# Patient Record
Sex: Female | Born: 1985 | Race: White | Hispanic: No | Marital: Single | Smoking: Never smoker
Health system: Southern US, Community
[De-identification: ages and names within clinical notes are randomized; demographics above are authoritative.]

---

## 2005-01-25 ENCOUNTER — Emergency Department (HOSPITAL_COMMUNITY): Admission: EM | Admit: 2005-01-25 | Discharge: 2005-01-25 | Payer: Self-pay | Admitting: Emergency Medicine

## 2005-01-27 ENCOUNTER — Emergency Department (HOSPITAL_COMMUNITY): Admission: EM | Admit: 2005-01-27 | Discharge: 2005-01-27 | Payer: Self-pay | Admitting: Emergency Medicine

## 2011-12-11 ENCOUNTER — Other Ambulatory Visit: Payer: Self-pay | Admitting: Obstetrics and Gynecology

## 2011-12-11 DIAGNOSIS — N6320 Unspecified lump in the left breast, unspecified quadrant: Secondary | ICD-10-CM

## 2011-12-15 ENCOUNTER — Ambulatory Visit
Admission: RE | Admit: 2011-12-15 | Discharge: 2011-12-15 | Disposition: A | Discharge status: Home / Self Care | Payer: BC Managed Care – PPO | Source: Ambulatory Visit | Attending: Obstetrics and Gynecology | Admitting: Obstetrics and Gynecology

## 2011-12-15 ENCOUNTER — Other Ambulatory Visit: Payer: Self-pay | Admitting: Obstetrics and Gynecology

## 2011-12-15 DIAGNOSIS — N6320 Unspecified lump in the left breast, unspecified quadrant: Secondary | ICD-10-CM

## 2011-12-22 ENCOUNTER — Ambulatory Visit
Admission: RE | Admit: 2011-12-22 | Discharge: 2011-12-22 | Disposition: A | Discharge status: Home / Self Care | Payer: BC Managed Care – PPO | Source: Ambulatory Visit | Attending: Obstetrics and Gynecology | Admitting: Obstetrics and Gynecology

## 2011-12-22 DIAGNOSIS — N6320 Unspecified lump in the left breast, unspecified quadrant: Secondary | ICD-10-CM

## 2012-02-06 ENCOUNTER — Other Ambulatory Visit: Payer: Self-pay | Admitting: Obstetrics and Gynecology

## 2012-02-06 DIAGNOSIS — R921 Mammographic calcification found on diagnostic imaging of breast: Secondary | ICD-10-CM

## 2012-02-06 DIAGNOSIS — N63 Unspecified lump in unspecified breast: Secondary | ICD-10-CM

## 2012-02-20 ENCOUNTER — Other Ambulatory Visit: Payer: BC Managed Care – PPO

## 2012-02-25 ENCOUNTER — Ambulatory Visit
Admission: RE | Admit: 2012-02-25 | Discharge: 2012-02-25 | Disposition: A | Discharge status: Home / Self Care | Payer: BC Managed Care – PPO | Source: Ambulatory Visit | Attending: Obstetrics and Gynecology | Admitting: Obstetrics and Gynecology

## 2012-02-25 DIAGNOSIS — N63 Unspecified lump in unspecified breast: Secondary | ICD-10-CM

## 2012-03-09 ENCOUNTER — Other Ambulatory Visit: Payer: BC Managed Care – PPO

## 2012-09-27 ENCOUNTER — Other Ambulatory Visit: Payer: Self-pay | Admitting: Obstetrics and Gynecology

## 2012-09-27 DIAGNOSIS — N6320 Unspecified lump in the left breast, unspecified quadrant: Secondary | ICD-10-CM

## 2012-09-27 DIAGNOSIS — N644 Mastodynia: Secondary | ICD-10-CM

## 2012-10-11 ENCOUNTER — Ambulatory Visit
Admission: RE | Admit: 2012-10-11 | Discharge: 2012-10-11 | Disposition: A | Discharge status: Home / Self Care | Payer: BC Managed Care – PPO | Source: Ambulatory Visit | Attending: Obstetrics and Gynecology | Admitting: Obstetrics and Gynecology

## 2012-10-11 DIAGNOSIS — N6320 Unspecified lump in the left breast, unspecified quadrant: Secondary | ICD-10-CM

## 2012-10-11 DIAGNOSIS — N644 Mastodynia: Secondary | ICD-10-CM

## 2013-05-19 ENCOUNTER — Other Ambulatory Visit: Payer: Self-pay | Admitting: Physician Assistant

## 2013-05-19 ENCOUNTER — Ambulatory Visit
Admission: RE | Admit: 2013-05-19 | Discharge: 2013-05-19 | Disposition: A | Discharge status: Home / Self Care | Payer: BC Managed Care – PPO | Source: Ambulatory Visit | Attending: Physician Assistant | Admitting: Physician Assistant

## 2013-05-19 DIAGNOSIS — M549 Dorsalgia, unspecified: Secondary | ICD-10-CM

## 2015-04-09 ENCOUNTER — Emergency Department (HOSPITAL_COMMUNITY): Payer: BC Managed Care – PPO

## 2015-04-09 ENCOUNTER — Emergency Department (HOSPITAL_COMMUNITY)
Admission: EM | Admit: 2015-04-09 | Discharge: 2015-04-10 | Disposition: A | Discharge status: Home / Self Care | Payer: BC Managed Care – PPO | Attending: Emergency Medicine | Admitting: Emergency Medicine

## 2015-04-09 ENCOUNTER — Encounter (HOSPITAL_COMMUNITY): Payer: Self-pay

## 2015-04-09 DIAGNOSIS — Z3202 Encounter for pregnancy test, result negative: Secondary | ICD-10-CM | POA: Insufficient documentation

## 2015-04-09 DIAGNOSIS — N83202 Unspecified ovarian cyst, left side: Secondary | ICD-10-CM

## 2015-04-09 DIAGNOSIS — R1030 Lower abdominal pain, unspecified: Secondary | ICD-10-CM | POA: Diagnosis present

## 2015-04-09 DIAGNOSIS — R102 Pelvic and perineal pain: Secondary | ICD-10-CM

## 2015-04-09 LAB — WET PREP, GENITAL
Clue Cells Wet Prep HPF POC: NONE SEEN
Sperm: NONE SEEN
Trich, Wet Prep: NONE SEEN
Yeast Wet Prep HPF POC: NONE SEEN

## 2015-04-09 LAB — URINALYSIS, ROUTINE W REFLEX MICROSCOPIC
Bilirubin Urine: NEGATIVE
Glucose, UA: NEGATIVE mg/dL
HGB URINE DIPSTICK: NEGATIVE
Ketones, ur: NEGATIVE mg/dL
Leukocytes, UA: NEGATIVE
Nitrite: NEGATIVE
PH: 5.5 (ref 5.0–8.0)
PROTEIN: NEGATIVE mg/dL
SPECIFIC GRAVITY, URINE: 1.026 (ref 1.005–1.030)

## 2015-04-09 LAB — POC URINE PREG, ED: Preg Test, Ur: NEGATIVE

## 2015-04-09 NOTE — Progress Notes (Signed)
Patient listed as having [employer] insurance without a pcp.  EDCM spoke to patient at bedside.  Patient reports her pcp is located at the [facility] [place].  System updated.

## 2015-04-09 NOTE — ED Notes (Signed)
Pt complains of lower abd pain since yesterday at 4pm, no vomiting or diarrhea

## 2015-04-09 NOTE — ED Provider Notes (Signed)
CSN: [id number]     Arrival date & time 04/09/15  2017 History   First MD Initiated Contact with Patient 04/09/15 2128     Chief Complaint  Patient presents with  . Abdominal Pain   (Consider location/radiation/quality/duration/timing/severity/associated sxs/prior Treatment) HPI 30 y.o. female presents to the Emergency Department today complaining of lower abdominal pain since yesterday around 4p. States that the pain is a 6/10 and a dull ache. Constant. Most of the pain is isolated suprapubic. Pain appears better with rest, but worse with movement. OTC treatments without relief. No fevers. No CP/SOB. Notes Dysuria. No vaginal bleeding/discahrge/itching. No other symptoms noted.    LMP- 1 month ago  History reviewed. No pertinent past medical history. History reviewed. No pertinent past surgical history. History reviewed. No pertinent family history. Social History  Substance Use Topics  . Smoking status: Never Smoker   . Smokeless tobacco: None  . Alcohol Use: No   OB History    No data available     Review of Systems ROS reviewed and all are negative for acute change except as noted in the HPI.  Allergies  Review of patient's allergies indicates no known allergies.  Home Medications   Prior to Admission medications   Medication Sig Start Date End Date Taking? Authorizing Provider  ibuprofen (ADVIL,MOTRIN) 200 MG tablet Take 400 mg by mouth every 6 (six) hours as needed for headache or moderate pain.   Yes Historical Provider, MD  Probiotic Product (PROBIOTIC PO) Take 1 capsule by mouth daily as needed (abdominal pain).   Yes Historical Provider, MD   BP 122/91 mmHg  Pulse 91  Temp(Src) 97.8 F (36.6 C) (Oral)  Resp 16  SpO2 99%  LMP 03/16/2015   Physical Exam  Constitutional: She is oriented to person, place, and time. She appears well-developed and well-nourished.  HENT:  Head: Normocephalic and atraumatic.  Eyes: EOM are normal. Pupils are equal, round, and  reactive to light.  Neck: Normal range of motion. Neck supple.  Cardiovascular: Normal rate, regular rhythm, normal heart sounds and intact distal pulses.   No murmur heard. Pulmonary/Chest: Effort normal and breath sounds normal. No respiratory distress. She has no wheezes. She has no rales. She exhibits no tenderness.  Abdominal: Soft. Normal appearance and bowel sounds are normal. There is tenderness in the suprapubic area. There is no rigidity, no rebound, no guarding, no CVA tenderness, no tenderness at McBurney's point and negative Murphy's sign.  Musculoskeletal: Normal range of motion.  Neurological: She is alert and oriented to person, place, and time.  Skin: Skin is warm and dry.  Psychiatric: She has a normal mood and affect. Her behavior is normal. Thought content normal.  Nursing note and vitals reviewed.  ED Course  Procedures (including critical care time) Labs Review Labs Reviewed  WET PREP, GENITAL - Abnormal; Notable for the following:    WBC, Wet Prep HPF POC MODERATE (*)    All other components within normal limits  URINALYSIS, ROUTINE W REFLEX MICROSCOPIC (NOT AT [facility]) - Abnormal; Notable for the following:    Color, Urine AMBER (*)    APPearance CLOUDY (*)    All other components within normal limits  POC URINE PREG, ED  GC/CHLAMYDIA PROBE AMP ([place]) NOT AT [facility]   Imaging Review [place] Transvaginal Non-ob  04/10/2015  CLINICAL DATA:  Lower abdominal pain, onset at 16:00. EXAM: TRANSABDOMINAL AND TRANSVAGINAL ULTRASOUND OF PELVIS TECHNIQUE: Both transabdominal and transvaginal ultrasound examinations of the pelvis were performed. Transabdominal technique  was performed for global imaging of the pelvis including uterus, ovaries, adnexal regions, and pelvic cul-de-sac. It was necessary to proceed with endovaginal exam following the transabdominal exam to visualize the ovaries. COMPARISON:  None FINDINGS: Uterus Measurements: 6.5 x 3.7 x 4.4 cm. No fibroids or other  mass visualized. Endometrium Thickness: 7 mm.  No focal abnormality visualized. Right ovary Measurements: 2.4 x 2.4 x 2.3 cm. Normal appearance/no adnexal mass. Left ovary Measurements: 6.2 x 4.2 x 6.1 cm. There is a partially collapsed mildly complex cyst measuring 0.9 x 2.2 x 3.1 cm. This may be hemorrhagic. Other findings Small to moderate volume free pelvic fluid with mild complexity, suggesting hemorrhage. IMPRESSION: Partially collapsed left ovarian cyst. Mildly complex small-moderate volume pelvic fluid could represent acute hemorrhage. Electronically Signed   By: Ellery Plunk M.D.   On: 04/10/2015 00:18   US Pelvis Complete  04/10/2015  CLINICAL DATA:  Lower abdominal pain, onset at 16:00. EXAM: TRANSABDOMINAL AND TRANSVAGINAL ULTRASOUND OF PELVIS TECHNIQUE: Both transabdominal and transvaginal ultrasound examinations of the pelvis were performed. Transabdominal technique was performed for global imaging of the pelvis including uterus, ovaries, adnexal regions, and pelvic cul-de-sac. It was necessary to proceed with endovaginal exam following the transabdominal exam to visualize the ovaries. COMPARISON:  None FINDINGS: Uterus Measurements: 6.5 x 3.7 x 4.4 cm. No fibroids or other mass visualized. Endometrium Thickness: 7 mm.  No focal abnormality visualized. Right ovary Measurements: 2.4 x 2.4 x 2.3 cm. Normal appearance/no adnexal mass. Left ovary Measurements: 6.2 x 4.2 x 6.1 cm. There is a partially collapsed mildly complex cyst measuring 0.9 x 2.2 x 3.1 cm. This may be hemorrhagic. Other findings Small to moderate volume free pelvic fluid with mild complexity, suggesting hemorrhage. IMPRESSION: Partially collapsed left ovarian cyst. Mildly complex small-moderate volume pelvic fluid could represent acute hemorrhage. Electronically Signed   By: Ellery Plunk M.D.   On: 04/10/2015 00:18   I have personally reviewed and evaluated these images and lab results as part of my medical  decision-making.   EKG Interpretation None      MDM  I have reviewed and evaluated the relevant laboratory values. I have reviewed and evaluated the relevant imaging studies. I have reviewed the relevant previous healthcare records. I obtained HPI from historian. Patient discussed with supervising physician  ED Course:  Assessment: Pt is a 29yF who presents with lower abdominal pain since yesterday. On exam, pt in NAD. Nontoxic/nonseptic appearing. VSS. Afebrile. Lungs CTA. Heart RRR. Abdomen TTP suprapubic. Pelvic exam unreamarkable. Labs unreamrkable. Pelvic US showed collapsed ovarian cyst, which could be attributing to the symptoms. Plan is to DC home with symptomatic treatment and follow up with PCP. At time of discharge, Patient is in no acute distress. Vital Signs are stable. Patient is able to ambulate. Patient able to tolerate PO.    Disposition/Plan:  DC Home Additional Verbal discharge instructions given and discussed with patient.  Pt Instructed to f/u with PCP in the next 48 hours for evaluation and treatment of symptoms. Return precautions given Pt acknowledges and agrees with plan  Supervising Physician Melene Plan, DO   Final diagnoses:  Cyst of left ovary      Audry Pili, PA-C 04/10/15 0030  Lavera Guise, MD 04/10/15 [phone]

## 2015-04-10 LAB — GC/CHLAMYDIA PROBE AMP (~~LOC~~) NOT AT ARMC
Chlamydia: NEGATIVE
Neisseria Gonorrhea: NEGATIVE

## 2015-04-10 NOTE — ED Notes (Signed)
Patient d/c'd self care.  F/U reviewed.  Patient verbalized understanding. 

## 2015-04-10 NOTE — Discharge Instructions (Signed)
Please read and follow all provided instructions.  Your diagnoses today include:  1. Cyst of left ovary   2. Pelvic pain in female    Tests performed today include:  Vital signs. See below for your results today.   Medications prescribed:   None   Home care instructions:  Follow any educational materials contained in this packet.  Follow-up instructions: Please follow-up with your primary care provider in the next week for further evaluation of symptoms and treatment   Return instructions:   Please return to the Emergency Department if you do not get better, if you get worse, or new symptoms OR  - Fever (temperature greater than 101.32F)  - Bleeding that does not stop with holding pressure to the area    -Severe pain (please note that you may be more sore the day after your accident)  - Chest Pain  - Difficulty breathing  - Severe nausea or vomiting  - Inability to tolerate food and liquids  - Passing out  - Skin becoming red around your wounds  - Change in mental status (confusion or lethargy)  - New numbness or weakness     Please return if you have any other emergent concerns.  Additional Information:  Your vital signs today were: BP 122/91 mmHg   Pulse 91   Temp(Src) 97.8 F (36.6 C) (Oral)   Resp 16   SpO2 99%   LMP 03/16/2015 If your blood pressure (BP) was elevated above 135/85 this visit, please have this repeated by your doctor within one month. ---------------

## 2017-01-24 IMAGING — US US TRANSVAGINAL NON-OB
1 series · 14 of 25 positions shown · non-contrast
Comparison: None

CLINICAL DATA: Lower abdominal pain, onset at [DATE].



[Series 1: us transvaginal non-ob · 0.24mm/px · 14 of 53 slices shown]
[im 1/53]
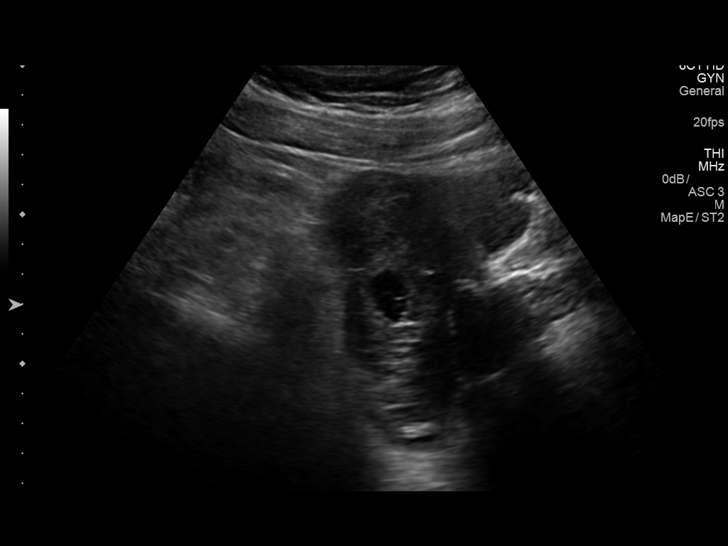
[im 5/53]
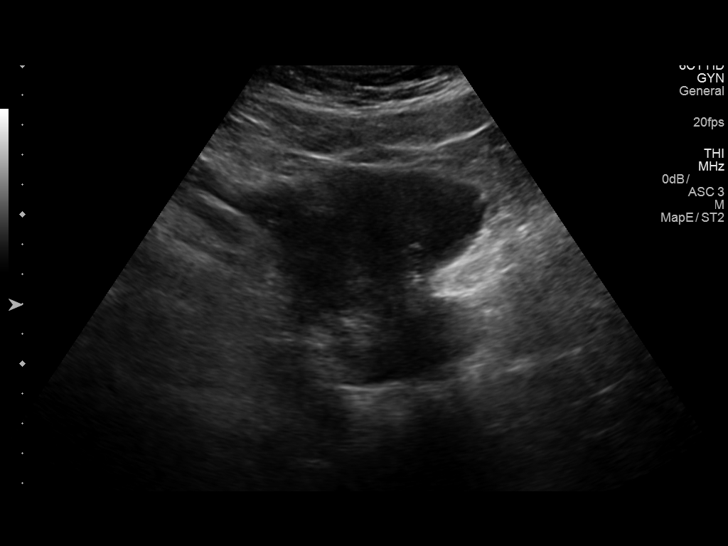
[im 9/53]
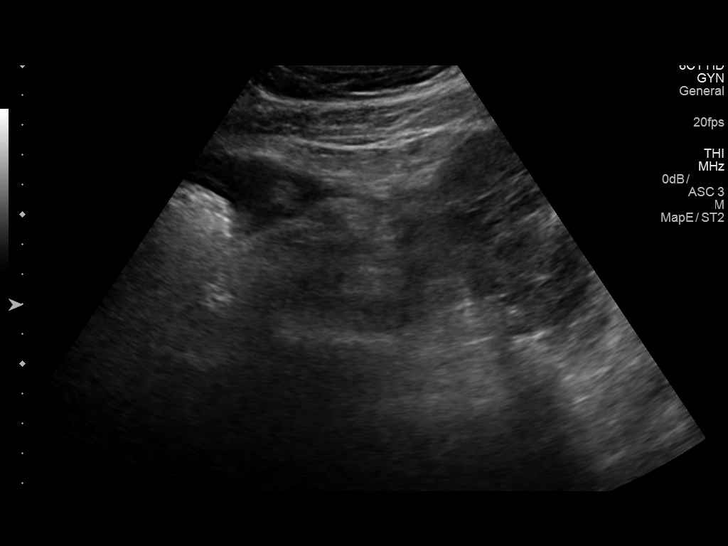
[im 14/53]
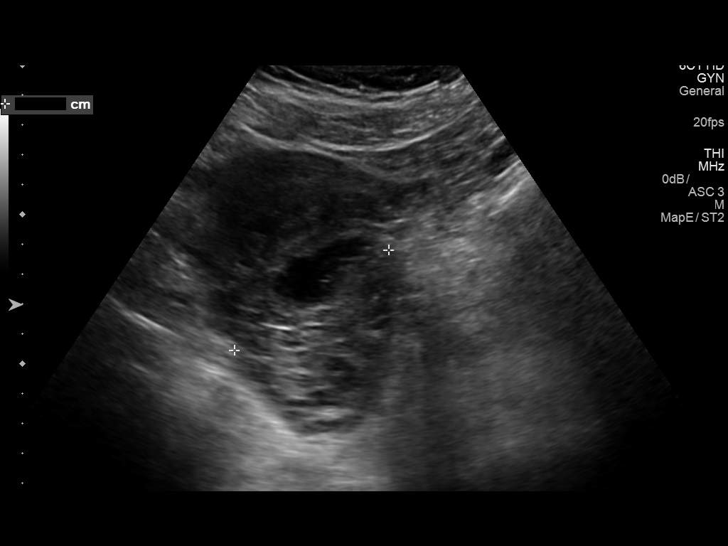
[im 18/53]
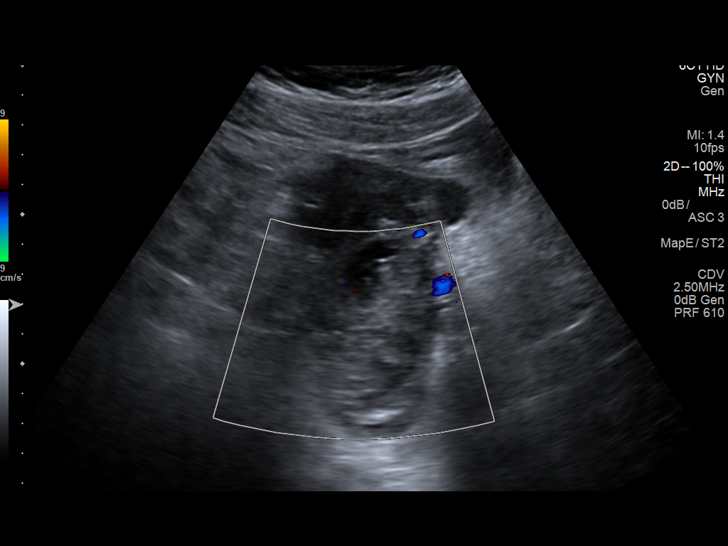
[im 20/53]
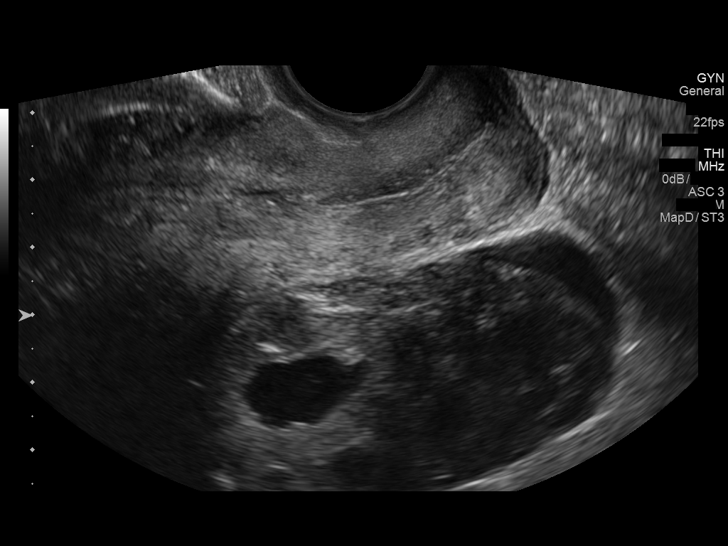
[im 24/53]
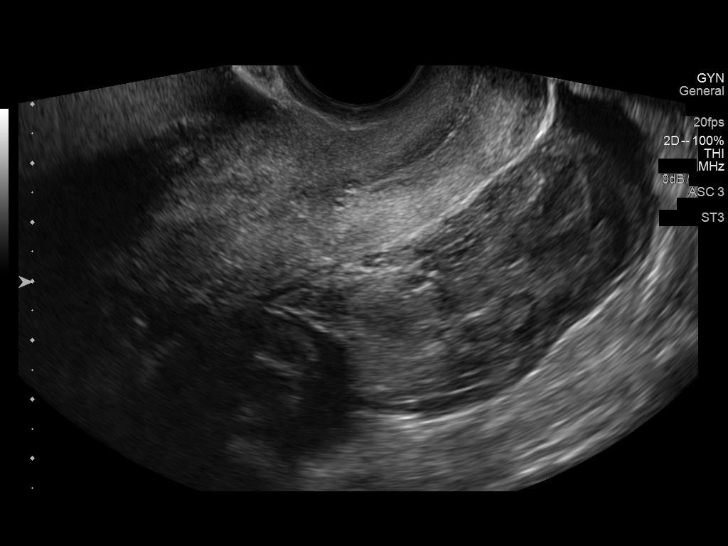
[im 29/53]
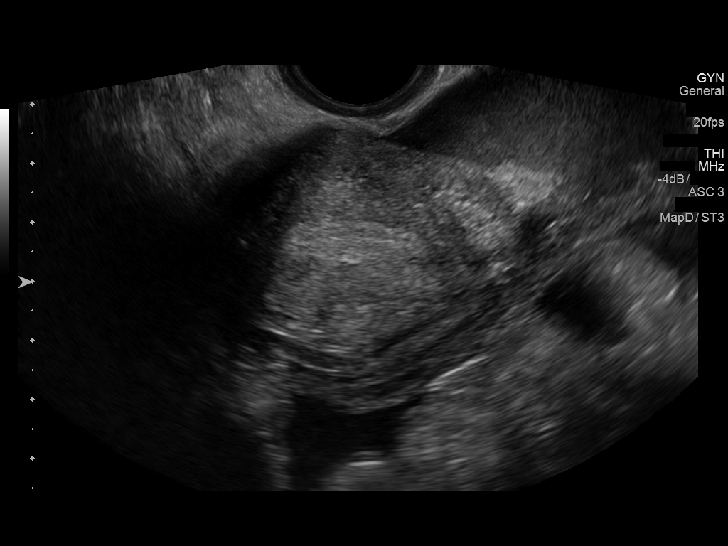
[im 33/53]
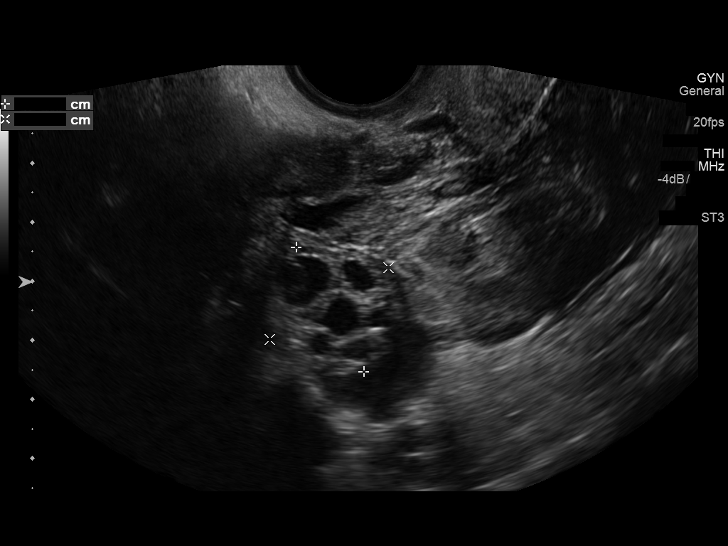
[im 35/53]
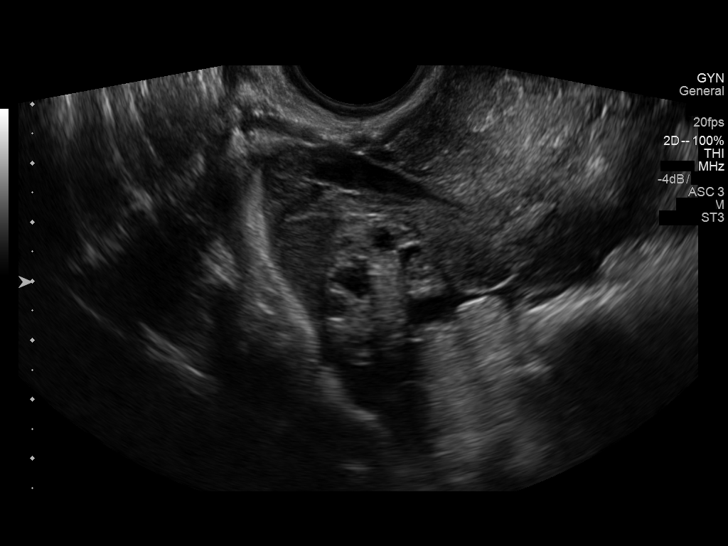
[im 40/53]
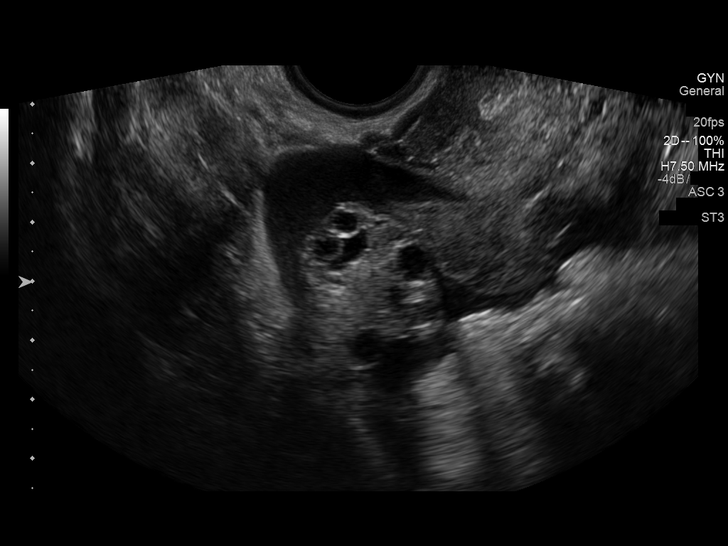
[im 44/53]
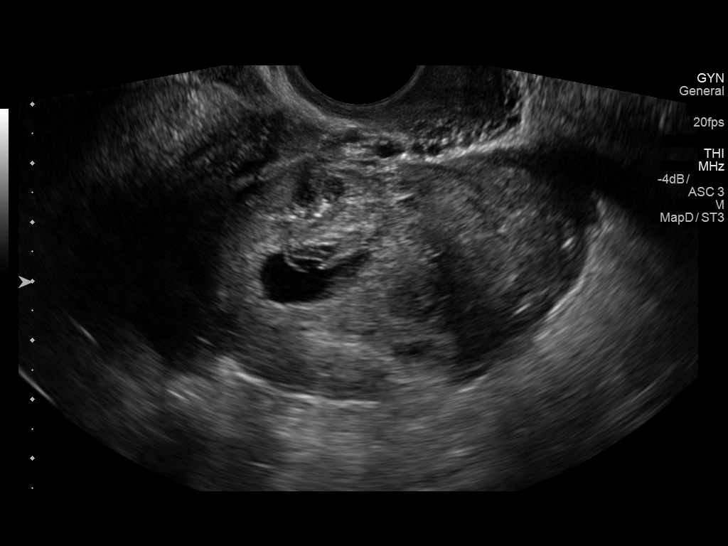
[im 48/53]
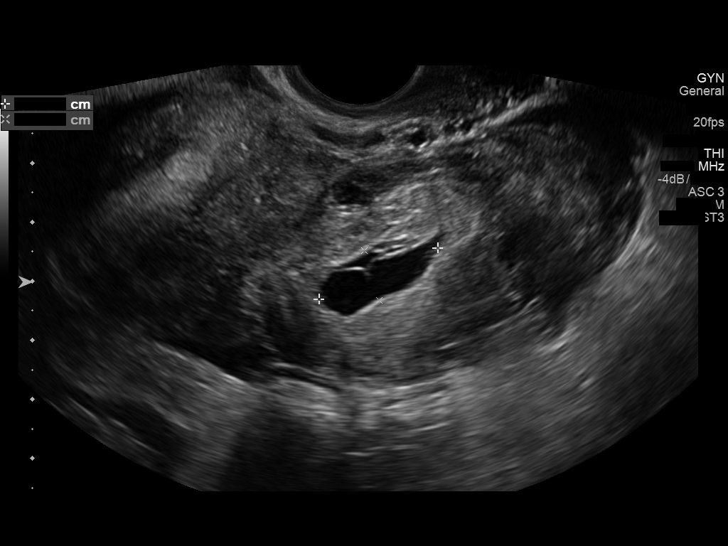
[im 53/53]
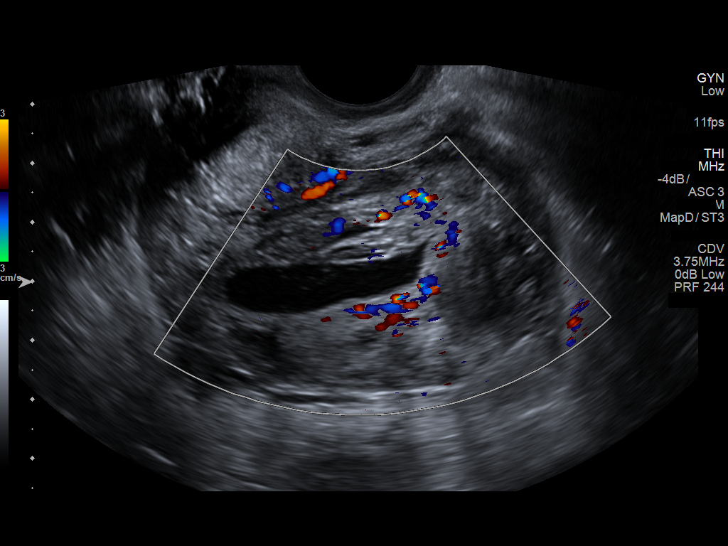

[14 of 25 positions shown; findings below may reference images not displayed]

FINDINGS: Uterus

Measurements: 6.5 x 3.7 x 4.4 cm. No fibroids or other mass
visualized.

Endometrium

Thickness: 7 mm.  No focal abnormality visualized.

Right ovary

Measurements: 2.4 x 2.4 x 2.3 cm. Normal appearance/no adnexal mass.

Left ovary

Measurements: 6.2 x 4.2 x 6.1 cm. There is a partially collapsed
mildly complex cyst measuring 0.9 x 2.2 x 3.1 cm. This may be
hemorrhagic.

Other findings

Small to moderate volume free pelvic fluid with mild complexity,
suggesting hemorrhage.
IMPRESSION: Partially collapsed left ovarian cyst. Mildly complex small-moderate
volume pelvic fluid could represent acute hemorrhage.
